# Patient Record
Sex: Male | Born: 2000 | Race: White | Hispanic: No | Marital: Single
Health system: Southern US, Community
[De-identification: ages and names within clinical notes are randomized; demographics above are authoritative.]

## PROBLEM LIST (undated history)

## (undated) DIAGNOSIS — N200 Calculus of kidney: Secondary | ICD-10-CM

---
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
## 2021-03-30 ENCOUNTER — Telehealth: Payer: Self-pay
